# Patient Record
Sex: Female | Born: 1960 | Race: White | Hispanic: No | Marital: Married | Smoking: Never smoker
Health system: Southern US, Community
[De-identification: ages and names within clinical notes are randomized; demographics above are authoritative.]

---

## 1998-03-25 ENCOUNTER — Other Ambulatory Visit: Admission: RE | Admit: 1998-03-25 | Discharge: 1998-03-25 | Payer: Self-pay | Admitting: *Deleted

## 1999-09-10 ENCOUNTER — Other Ambulatory Visit: Admission: RE | Admit: 1999-09-10 | Discharge: 1999-09-10 | Payer: Self-pay | Admitting: *Deleted

## 2006-04-15 ENCOUNTER — Ambulatory Visit: Payer: Self-pay

## 2007-10-17 ENCOUNTER — Ambulatory Visit: Payer: Self-pay | Admitting: Obstetrics & Gynecology

## 2008-12-31 ENCOUNTER — Ambulatory Visit: Payer: Self-pay | Admitting: Obstetrics & Gynecology

## 2010-01-08 ENCOUNTER — Ambulatory Visit: Payer: Self-pay | Admitting: Podiatry

## 2010-05-15 ENCOUNTER — Ambulatory Visit: Payer: Self-pay | Admitting: Obstetrics & Gynecology

## 2011-07-13 ENCOUNTER — Ambulatory Visit: Payer: Self-pay | Admitting: Obstetrics & Gynecology

## 2012-08-11 ENCOUNTER — Ambulatory Visit: Payer: Self-pay | Admitting: Obstetrics & Gynecology

## 2013-09-27 ENCOUNTER — Ambulatory Visit: Payer: Self-pay | Admitting: Family Medicine

## 2013-10-12 ENCOUNTER — Ambulatory Visit: Payer: Self-pay | Admitting: Emergency Medicine

## 2014-11-27 ENCOUNTER — Ambulatory Visit: Payer: Self-pay | Admitting: Family Medicine

## 2016-03-25 ENCOUNTER — Other Ambulatory Visit: Payer: Self-pay | Admitting: Family Medicine

## 2016-03-25 DIAGNOSIS — Z1231 Encounter for screening mammogram for malignant neoplasm of breast: Secondary | ICD-10-CM

## 2016-03-31 ENCOUNTER — Ambulatory Visit
Admission: RE | Admit: 2016-03-31 | Discharge: 2016-03-31 | Disposition: A | Discharge status: Home / Self Care | Payer: BC Managed Care – PPO | Source: Ambulatory Visit | Attending: Family Medicine | Admitting: Family Medicine

## 2016-03-31 DIAGNOSIS — Z1231 Encounter for screening mammogram for malignant neoplasm of breast: Secondary | ICD-10-CM | POA: Insufficient documentation

## 2017-07-15 ENCOUNTER — Other Ambulatory Visit: Payer: Self-pay | Admitting: Family Medicine

## 2017-07-15 DIAGNOSIS — Z1231 Encounter for screening mammogram for malignant neoplasm of breast: Secondary | ICD-10-CM

## 2017-07-28 ENCOUNTER — Ambulatory Visit
Admission: RE | Admit: 2017-07-28 | Discharge: 2017-07-28 | Disposition: A | Discharge status: Home / Self Care | Payer: BC Managed Care – PPO | Source: Ambulatory Visit | Attending: Family Medicine | Admitting: Family Medicine

## 2017-07-28 DIAGNOSIS — Z1231 Encounter for screening mammogram for malignant neoplasm of breast: Secondary | ICD-10-CM | POA: Insufficient documentation

## 2018-08-31 ENCOUNTER — Other Ambulatory Visit: Payer: Self-pay | Admitting: Family Medicine

## 2018-08-31 DIAGNOSIS — Z1231 Encounter for screening mammogram for malignant neoplasm of breast: Secondary | ICD-10-CM

## 2018-09-21 ENCOUNTER — Encounter (INDEPENDENT_AMBULATORY_CARE_PROVIDER_SITE_OTHER): Payer: Self-pay

## 2018-09-21 ENCOUNTER — Ambulatory Visit
Admission: RE | Admit: 2018-09-21 | Discharge: 2018-09-21 | Disposition: A | Discharge status: Home / Self Care | Payer: BC Managed Care – PPO | Source: Ambulatory Visit | Attending: Family Medicine | Admitting: Family Medicine

## 2018-09-21 DIAGNOSIS — Z1231 Encounter for screening mammogram for malignant neoplasm of breast: Secondary | ICD-10-CM | POA: Diagnosis present

## 2019-12-11 ENCOUNTER — Other Ambulatory Visit: Payer: Self-pay | Admitting: Family Medicine

## 2019-12-11 DIAGNOSIS — Z1231 Encounter for screening mammogram for malignant neoplasm of breast: Secondary | ICD-10-CM

## 2019-12-20 ENCOUNTER — Other Ambulatory Visit: Payer: Self-pay

## 2019-12-20 ENCOUNTER — Ambulatory Visit
Admission: RE | Admit: 2019-12-20 | Discharge: 2019-12-20 | Disposition: A | Discharge status: Home / Self Care | Payer: BC Managed Care – PPO | Source: Ambulatory Visit | Attending: Family Medicine | Admitting: Family Medicine

## 2019-12-20 DIAGNOSIS — Z1231 Encounter for screening mammogram for malignant neoplasm of breast: Secondary | ICD-10-CM | POA: Diagnosis present

## 2021-01-20 ENCOUNTER — Other Ambulatory Visit: Payer: Self-pay | Admitting: Family Medicine

## 2021-01-20 DIAGNOSIS — Z1231 Encounter for screening mammogram for malignant neoplasm of breast: Secondary | ICD-10-CM

## 2021-01-29 ENCOUNTER — Other Ambulatory Visit: Payer: Self-pay

## 2021-01-29 ENCOUNTER — Ambulatory Visit
Admission: RE | Admit: 2021-01-29 | Discharge: 2021-01-29 | Disposition: A | Discharge status: Home / Self Care | Payer: BC Managed Care – PPO | Source: Ambulatory Visit | Attending: Family Medicine | Admitting: Family Medicine

## 2021-01-29 DIAGNOSIS — Z1231 Encounter for screening mammogram for malignant neoplasm of breast: Secondary | ICD-10-CM | POA: Diagnosis not present

## 2021-02-05 ENCOUNTER — Other Ambulatory Visit: Payer: Self-pay | Admitting: Family Medicine

## 2021-02-05 DIAGNOSIS — N6310 Unspecified lump in the right breast, unspecified quadrant: Secondary | ICD-10-CM

## 2021-02-05 DIAGNOSIS — R928 Other abnormal and inconclusive findings on diagnostic imaging of breast: Secondary | ICD-10-CM

## 2021-02-11 ENCOUNTER — Ambulatory Visit
Admission: RE | Admit: 2021-02-11 | Discharge: 2021-02-11 | Disposition: A | Discharge status: Home / Self Care | Payer: BC Managed Care – PPO | Source: Ambulatory Visit | Attending: Family Medicine | Admitting: Family Medicine

## 2021-02-11 ENCOUNTER — Other Ambulatory Visit: Payer: Self-pay

## 2021-02-11 DIAGNOSIS — N6310 Unspecified lump in the right breast, unspecified quadrant: Secondary | ICD-10-CM | POA: Diagnosis present

## 2021-02-11 DIAGNOSIS — R928 Other abnormal and inconclusive findings on diagnostic imaging of breast: Secondary | ICD-10-CM | POA: Diagnosis not present

## 2021-04-28 IMAGING — MG MM DIGITAL DIAGNOSTIC UNILAT*R* W/ TOMO W/ CAD
4 series · 4 of 12 positions shown · non-contrast
Comparison: Previous exam(s).

CLINICAL DATA: Possible mass in the outer right breast on a recent
screening mammogram.

EXAM:
DIGITAL DIAGNOSTIC UNILATERAL RIGHT MAMMOGRAM WITH TOMOSYNTHESIS AND
CAD; ULTRASOUND RIGHT BREAST LIMITED
TECHNIQUE: Right digital diagnostic mammography and breast tomosynthesis was
performed. The images were evaluated with computer-aided detection.;
Targeted ultrasound examination of the right breast was performed

[R MLO synth-2D]
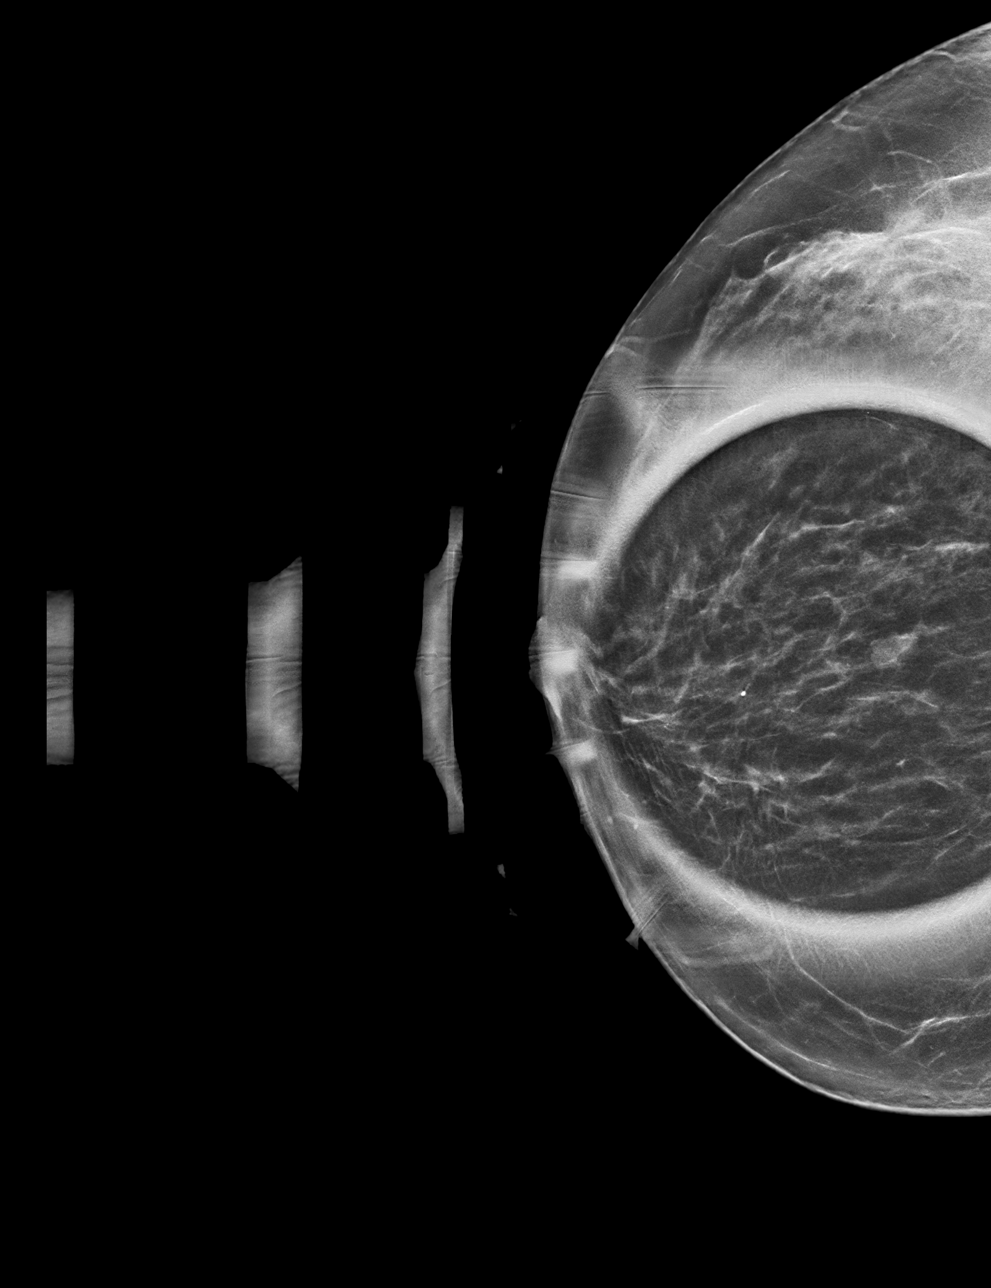

[R CC synth-2D]
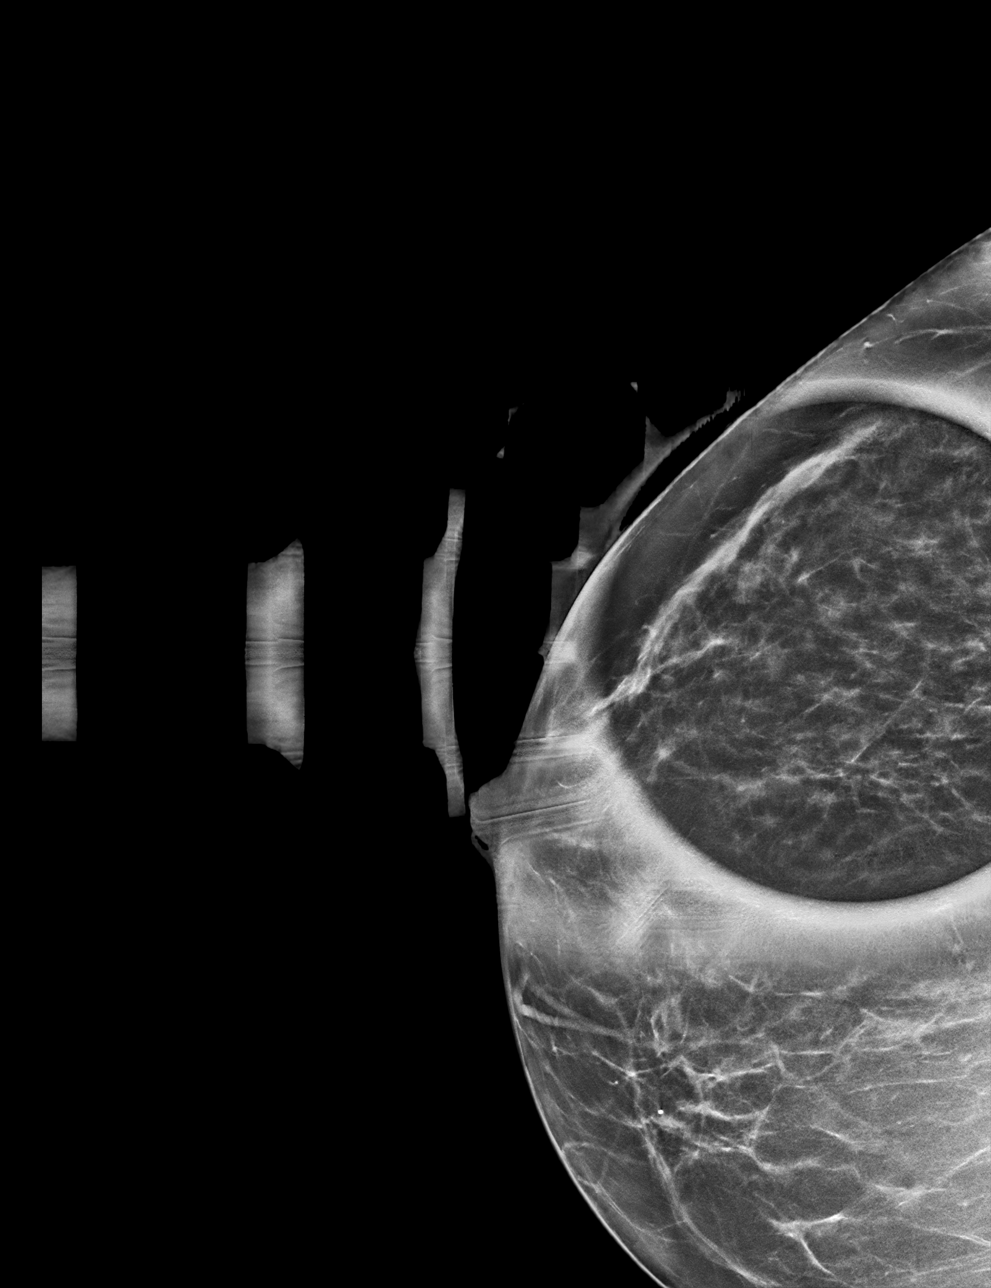

[R MLO tomo · tomo slice 29/57.0]
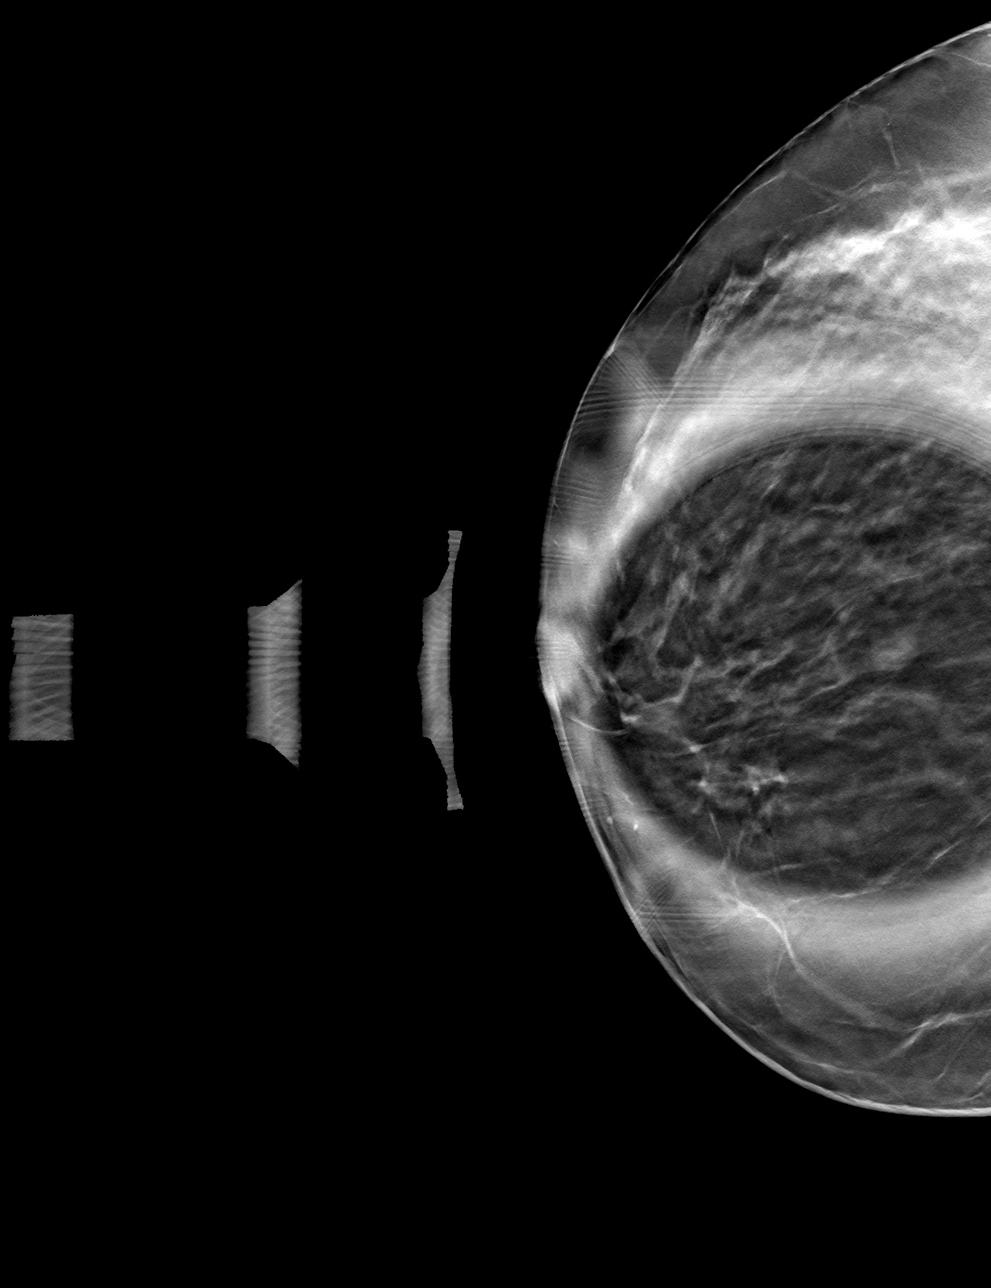

[R CC tomo · tomo slice 28/55.0]
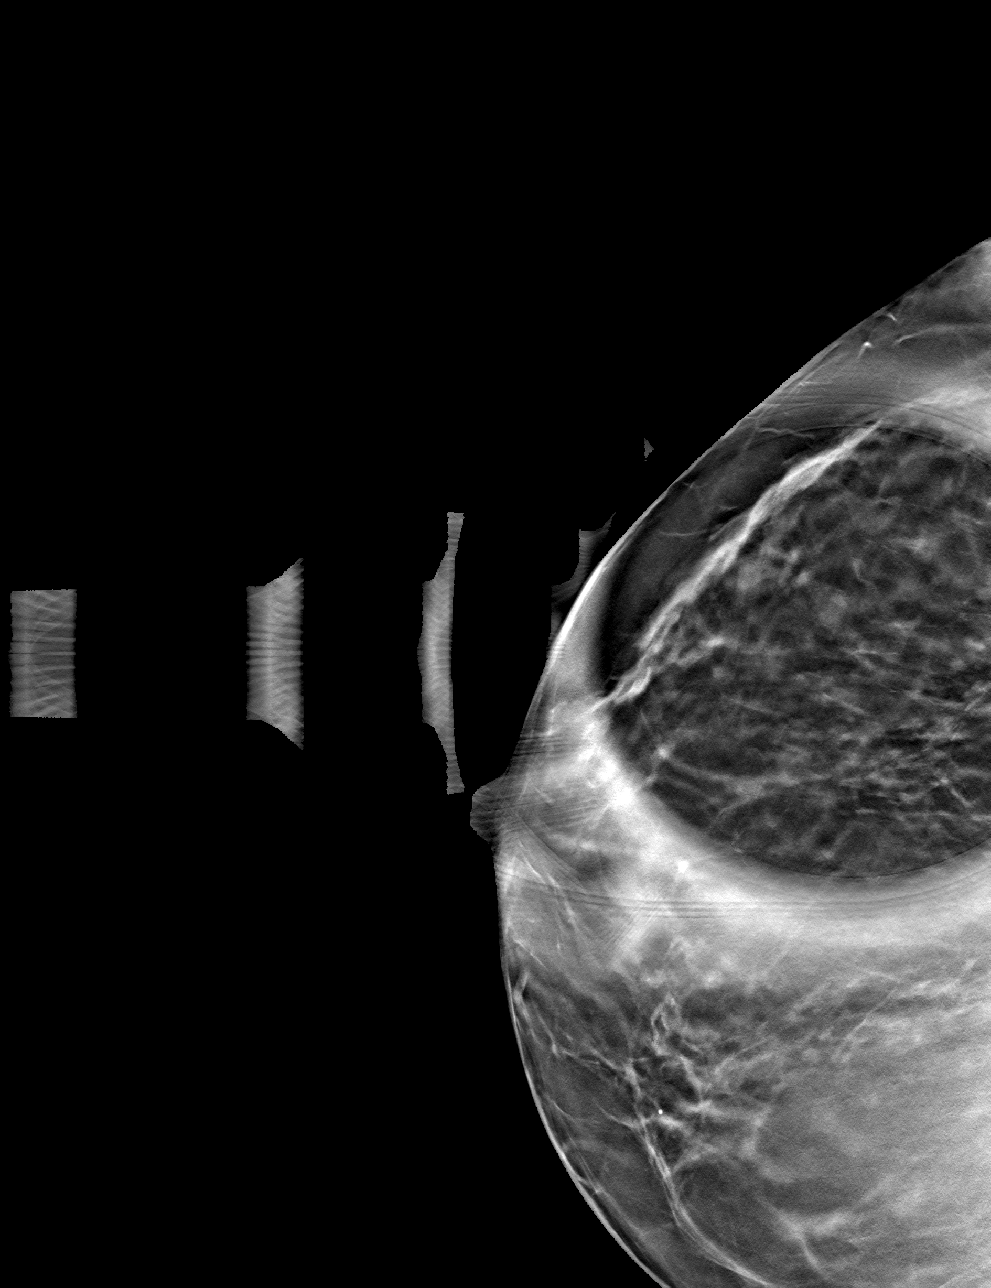

[4 of 12 positions shown; findings below may reference images not displayed]

ACR Breast Density Category b: There are scattered areas of
fibroglandular density.
FINDINGS: 3D tomographic and 2D generated spot compression images of the right
breast confirm a small, oval, circumscribed mass in the outer right
breast in the mid to anterior portion of the breast.

Targeted ultrasound is performed, showing an 8 x 5 x 5 mm cluster of
cysts in the 9 o'clock position of the right breast, 5 cm from the
nipple. This corresponds to the mammographic mass.
IMPRESSION: 8 mm benign cluster of cysts in the 9 o'clock position of the right
breast. No evidence of malignancy.

RECOMMENDATION:
Bilateral screening mammogram in 1 year.

I have discussed the findings and recommendations with the patient.
If applicable, a reminder letter will be sent to the patient
regarding the next appointment.

BI-RADS CATEGORY  2: Benign.

## 2022-04-28 ENCOUNTER — Other Ambulatory Visit: Payer: Self-pay | Admitting: Family Medicine

## 2022-04-28 DIAGNOSIS — Z1231 Encounter for screening mammogram for malignant neoplasm of breast: Secondary | ICD-10-CM

## 2022-06-03 ENCOUNTER — Ambulatory Visit: Payer: BC Managed Care – PPO

## 2023-03-02 ENCOUNTER — Ambulatory Visit
Admission: EM | Admit: 2023-03-02 | Discharge: 2023-03-02 | Disposition: A | Discharge status: Home / Self Care | Payer: BC Managed Care – PPO | Attending: Physician Assistant | Admitting: Physician Assistant

## 2023-03-02 DIAGNOSIS — N3001 Acute cystitis with hematuria: Secondary | ICD-10-CM | POA: Diagnosis not present

## 2023-03-02 LAB — URINALYSIS, W/ REFLEX TO CULTURE (INFECTION SUSPECTED)
Bilirubin Urine: NEGATIVE
Glucose, UA: NEGATIVE mg/dL
Ketones, ur: NEGATIVE mg/dL
Nitrite: NEGATIVE
Protein, ur: NEGATIVE mg/dL
Specific Gravity, Urine: <1.005 — ABNORMAL LOW (ref 1.005–1.030)
WBC, UA: >50 WBC/hpf (ref 0–5)
pH: 6 (ref 5.0–8.0)

## 2023-03-02 MED ORDER — NITROFURANTOIN MONOHYD MACRO 100 MG PO CAPS: 100.0000 mg | ORAL_CAPSULE | Freq: Two times a day (BID) | ORAL | 0 refills | Status: AC

## 2023-03-02 MED ORDER — NITROFURANTOIN MONOHYD MACRO 100 MG PO CAPS: 100.0000 mg | ORAL_CAPSULE | Freq: Two times a day (BID) | ORAL | 0 refills | Status: DC

## 2023-03-02 NOTE — ED Provider Notes (Signed)
[facility]    CSN: [id number] Arrival date & time: 03/02/23  [id number]      History   Chief Complaint Chief Complaint  Patient presents with   Urinary Urgency   Hematuria    HPI Kathleen Hartman [facility] is a 62 y.o. female.   Patient presents today with a 24-hour history of UTI symptoms.  Reports dysuria, urinary frequency, urinary urgency, hematuria.  She does report some generalized abdominal pain but denies any fever, nausea, vomiting, diarrhea.  She has tried Aleve over-the-counter with minimal improvement of symptoms.  Denies history of recurrent UTI but did have UTI approximately 6 months ago with similar presentation.  Denies additional antibiotics in the past 6 months since being treated for UTI.  She denies any recent urogenital procedure, self-catheterization, history of nephrolithiasis, single kidney, seeing a urologist in the past.  Denies history of diabetes and does not take an SGLT2 inhibitor.  Denies any vaginal symptoms or pelvic pain.    History reviewed. No pertinent past medical history.  There are no problems to display for this patient.   History reviewed. No pertinent surgical history.  OB History   No obstetric history on file.      Home Medications    Prior to Admission medications   Medication Sig Start Date End Date Taking? Authorizing Provider  cetirizine (ZYRTEC) 10 MG tablet Take 1 tablet by mouth daily. 01/13/23  Yes [provider]  lisinopril (ZESTRIL) 30 MG tablet Take by mouth. 01/13/23 01/13/24 Yes [provider]  nitrofurantoin, macrocrystal-monohydrate, (MACROBID) 100 MG capsule Take 1 capsule (100 mg total) by mouth 2 (two) times daily. 03/02/23  Yes Peniel Hass K, PA-C  sertraline (ZOLOFT) 50 MG tablet Take by mouth. 01/13/23  Yes [provider]    Family History Family History  Problem Relation Age of Onset   Breast cancer Neg Hx     Social History Social History   Tobacco Use   Smoking  status: Never   Smokeless tobacco: Never  Vaping Use   Vaping Use: Never used  Substance Use Topics   Alcohol use: Yes   Drug use: Never     Allergies   Penicillins, Sulfa antibiotics, Meloxicam, and Azithromycin   Review of Systems Review of Systems  Constitutional:  Positive for activity change. Negative for appetite change, fatigue and fever.  Gastrointestinal:  Positive for abdominal pain. Negative for diarrhea, nausea and vomiting.  Genitourinary:  Positive for dysuria, frequency, hematuria and urgency. Negative for pelvic pain, vaginal bleeding, vaginal discharge and vaginal pain.  Musculoskeletal:  Negative for arthralgias, back pain and myalgias.     Physical Exam Triage Vital Signs ED Triage Vitals  Enc Vitals Group     BP 03/02/23 0839 (!) 151/89     Pulse Rate 03/02/23 0839 64     Resp 03/02/23 0839 16     Temp --      Temp src --      SpO2 03/02/23 0839 100 %     Weight --      Height --      Head Circumference --      Peak Flow --      Pain Score 03/02/23 0838 0     Pain Loc --      Pain Edu? --      Excl. in [place]? --    No data found.  Updated Vital Signs BP (!) 151/85 (BP Location: Right Arm)   Pulse 64   Temp  97.8 F (36.6 C) (Temporal)   Resp 16   SpO2 99%   Visual Acuity Right Eye Distance:   Left Eye Distance:   Bilateral Distance:    Right Eye Near:   Left Eye Near:    Bilateral Near:     Physical Exam Vitals reviewed.  Constitutional:      General: She is awake. She is not in acute distress.    Appearance: Normal appearance. She is well-developed. She is not ill-appearing.     Comments: Very pleasant female appears stated age in no acute distress sitting comfortably in exam room  HENT:     Head: Normocephalic and atraumatic.  Cardiovascular:     Rate and Rhythm: Normal rate and regular rhythm.     Heart sounds: Normal heart sounds, S1 normal and S2 normal. No murmur heard. Pulmonary:     Effort: Pulmonary effort is normal.      Breath sounds: Normal breath sounds. No wheezing, rhonchi or rales.     Comments: Clear to auscultation bilaterally Abdominal:     General: Bowel sounds are normal.     Palpations: Abdomen is soft.     Tenderness: There is generalized abdominal tenderness. There is no right CVA tenderness, left CVA tenderness, guarding or rebound.     Comments: Mild tenderness to palpation throughout abdomen.  No evidence of acute abdomen on physical exam.  No CVA tenderness.  Psychiatric:        Behavior: Behavior is cooperative.      UC Treatments / Results  Labs (all labs ordered are listed, but only abnormal results are displayed) Labs Reviewed  URINALYSIS, W/ REFLEX TO CULTURE (INFECTION SUSPECTED) - Abnormal; Notable for the following components:      Result Value   Specific Gravity, Urine <1.005 (*)    Hgb urine dipstick MODERATE (*)    Leukocytes,Ua MODERATE (*)    Non Squamous Epithelial PRESENT (*)    Bacteria, UA FEW (*)    All other components within normal limits  URINE CULTURE    EKG   Radiology No results found.  Procedures Procedures (including critical care time)  Medications Ordered in UC Medications - No data to display  Initial Impression / Assessment and Plan / UC Course  I have reviewed the triage vital signs and the nursing notes.  Pertinent labs & imaging results that were available during my care of the patient were reviewed by me and considered in my medical decision making (see chart for details).     Patient is well-appearing, afebrile, nontoxic, nontachycardic.  Vital signs and physical exam are reassuring today.  UA showed some contamination but also evidence of infection.  Given patient is symptomatic we will empirically treat with nitrofurantoin.  Urine was sent for culture and we will contact her if we need to discontinue or change her antibiotics based on susceptibilities identified on culture.  She is to push fluids.  If her symptoms do not improving  within a few days she is to return for reevaluation.  If she has any worsening symptoms including abdominal pain, pelvic pain, fever, nausea, vomiting she needs to be seen immediately.  Strict return precautions given.  Work excuse note provided.  Final Clinical Impressions(s) / UC Diagnoses   Final diagnoses:  Acute cystitis with hematuria     Discharge Instructions      I believe that you have a UTI.  Please start nitrofurantoin twice daily for 5 days.  We are sending your urine for  culture and we will contact you if you need to stop or change your antibiotics.  Please make sure that you push fluids.  If you develop any worsening symptoms including abdominal pain, pelvic pain, fever, nausea, vomiting, flank/back pain you should be seen immediately.     ED Prescriptions     Medication Sig Dispense Auth. Provider   nitrofurantoin, macrocrystal-monohydrate, (MACROBID) 100 MG capsule Take 1 capsule (100 mg total) by mouth 2 (two) times daily. 10 capsule Kathleen Hartman, Derry Skill, PA-C      PDMP not reviewed this encounter.   Terrilee Croak, PA-C 03/02/23 [id number]

## 2023-03-02 NOTE — Discharge Instructions (Signed)
I believe that you have a UTI.  Please start nitrofurantoin twice daily for 5 days.  We are sending your urine for culture and we will contact you if you need to stop or change your antibiotics.  Please make sure that you push fluids.  If you develop any worsening symptoms including abdominal pain, pelvic pain, fever, nausea, vomiting, flank/back pain you should be seen immediately.

## 2023-03-02 NOTE — ED Triage Notes (Signed)
Patient presents to UC to rule out UTI. Yesterday she developed urgency, hematuria, dysuria. Chills since today. Took a dose of aleve.   Denies fever.

## 2023-03-03 LAB — URINE CULTURE: Culture: >=100000 — AB

## 2023-03-04 LAB — URINE CULTURE
